# Patient Record
Sex: Female | Born: 2000 | Race: White | Hispanic: No | Marital: Single | Smoking: Never smoker
Health system: Southern US, Community
[De-identification: ages and names within clinical notes are randomized; demographics above are authoritative.]

## PROBLEM LIST (undated history)

## (undated) DIAGNOSIS — D649 Anemia, unspecified: Secondary | ICD-10-CM

## (undated) DIAGNOSIS — T7840XA Allergy, unspecified, initial encounter: Secondary | ICD-10-CM

## (undated) DIAGNOSIS — F419 Anxiety disorder, unspecified: Secondary | ICD-10-CM

## (undated) HISTORY — DX: Anemia, unspecified: D64.9

## (undated) HISTORY — DX: Anxiety disorder, unspecified: F41.9

## (undated) HISTORY — DX: Allergy, unspecified, initial encounter: T78.40XA

---
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
## 2021-02-07 DIAGNOSIS — Z20828 Contact with and (suspected) exposure to other viral communicable diseases: Secondary | ICD-10-CM | POA: Diagnosis not present

## 2021-02-07 DIAGNOSIS — J069 Acute upper respiratory infection, unspecified: Secondary | ICD-10-CM | POA: Diagnosis not present

## 2021-02-07 DIAGNOSIS — J019 Acute sinusitis, unspecified: Secondary | ICD-10-CM | POA: Diagnosis not present

## 2021-03-24 DIAGNOSIS — H5203 Hypermetropia, bilateral: Secondary | ICD-10-CM | POA: Diagnosis not present

## 2021-03-24 DIAGNOSIS — H47292 Other optic atrophy, left eye: Secondary | ICD-10-CM | POA: Diagnosis not present

## 2021-06-10 DIAGNOSIS — J0100 Acute maxillary sinusitis, unspecified: Secondary | ICD-10-CM | POA: Diagnosis not present

## 2021-07-02 DIAGNOSIS — J0100 Acute maxillary sinusitis, unspecified: Secondary | ICD-10-CM | POA: Diagnosis not present

## 2021-07-02 DIAGNOSIS — R059 Cough, unspecified: Secondary | ICD-10-CM | POA: Diagnosis not present

## 2021-07-02 DIAGNOSIS — J209 Acute bronchitis, unspecified: Secondary | ICD-10-CM | POA: Diagnosis not present

## 2021-07-02 DIAGNOSIS — J029 Acute pharyngitis, unspecified: Secondary | ICD-10-CM | POA: Diagnosis not present

## 2021-07-02 DIAGNOSIS — Z20828 Contact with and (suspected) exposure to other viral communicable diseases: Secondary | ICD-10-CM | POA: Diagnosis not present

## 2021-08-18 DIAGNOSIS — N3000 Acute cystitis without hematuria: Secondary | ICD-10-CM | POA: Diagnosis not present

## 2021-08-18 DIAGNOSIS — L739 Follicular disorder, unspecified: Secondary | ICD-10-CM | POA: Diagnosis not present

## 2021-08-18 DIAGNOSIS — Z6823 Body mass index (BMI) 23.0-23.9, adult: Secondary | ICD-10-CM | POA: Diagnosis not present

## 2021-10-05 DIAGNOSIS — L700 Acne vulgaris: Secondary | ICD-10-CM | POA: Diagnosis not present

## 2021-11-26 DIAGNOSIS — Z79899 Other long term (current) drug therapy: Secondary | ICD-10-CM | POA: Diagnosis not present

## 2021-11-26 DIAGNOSIS — L700 Acne vulgaris: Secondary | ICD-10-CM | POA: Diagnosis not present

## 2022-07-07 ENCOUNTER — Other Ambulatory Visit: Payer: Self-pay | Admitting: Family Medicine

## 2022-07-07 DIAGNOSIS — L680 Hirsutism: Secondary | ICD-10-CM

## 2022-07-09 ENCOUNTER — Other Ambulatory Visit: Payer: Self-pay

## 2022-07-12 ENCOUNTER — Ambulatory Visit
Admission: RE | Admit: 2022-07-12 | Discharge: 2022-07-12 | Disposition: A | Discharge status: Home / Self Care | Payer: No Typology Code available for payment source | Source: Ambulatory Visit | Attending: Family Medicine | Admitting: Family Medicine

## 2022-07-12 DIAGNOSIS — L680 Hirsutism: Secondary | ICD-10-CM

## 2022-12-27 DIAGNOSIS — L700 Acne vulgaris: Secondary | ICD-10-CM | POA: Diagnosis not present

## 2023-01-06 DIAGNOSIS — Z79899 Other long term (current) drug therapy: Secondary | ICD-10-CM | POA: Diagnosis not present

## 2023-01-27 DIAGNOSIS — L700 Acne vulgaris: Secondary | ICD-10-CM | POA: Diagnosis not present

## 2023-02-01 ENCOUNTER — Ambulatory Visit (INDEPENDENT_AMBULATORY_CARE_PROVIDER_SITE_OTHER): Payer: Commercial Managed Care - PPO | Admitting: Internal Medicine

## 2023-02-01 ENCOUNTER — Encounter: Payer: Self-pay | Admitting: Internal Medicine

## 2023-02-01 VITALS — BP 124/80 | HR 58 | Wt 143.0 lb

## 2023-02-01 DIAGNOSIS — L709 Acne, unspecified: Secondary | ICD-10-CM | POA: Diagnosis not present

## 2023-02-01 DIAGNOSIS — L680 Hirsutism: Secondary | ICD-10-CM | POA: Insufficient documentation

## 2023-02-01 DIAGNOSIS — N914 Secondary oligomenorrhea: Secondary | ICD-10-CM | POA: Diagnosis not present

## 2023-02-01 LAB — LIPID PANEL
Cholesterol: 149 mg/dL (ref 0–200)
HDL: 57.7 mg/dL (ref >39.00)
LDL Cholesterol: 68 mg/dL (ref 0–99)
NonHDL: 90.85
Total CHOL/HDL Ratio: 3
Triglycerides: 116 mg/dL (ref 0.0–149.0)
VLDL: 23.2 mg/dL (ref 0.0–40.0)

## 2023-02-01 LAB — COMPREHENSIVE METABOLIC PANEL
ALT: 9 U/L (ref 0–35)
AST: 14 U/L (ref 0–37)
Albumin: 4.3 g/dL (ref 3.5–5.2)
Alkaline Phosphatase: 71 U/L (ref 39–117)
BUN: 9 mg/dL (ref 6–23)
CO2: 25 mEq/L (ref 19–32)
Calcium: 9.9 mg/dL (ref 8.4–10.5)
Chloride: 104 mEq/L (ref 96–112)
Creatinine, Ser: 0.71 mg/dL (ref 0.40–1.20)
GFR: 122.33 mL/min (ref >60.00)
Glucose, Bld: 80 mg/dL (ref 70–99)
Potassium: 4 mEq/L (ref 3.5–5.1)
Sodium: 138 mEq/L (ref 135–145)
Total Bilirubin: 0.4 mg/dL (ref 0.2–1.2)
Total Protein: 7.4 g/dL (ref 6.0–8.3)

## 2023-02-01 LAB — HEMOGLOBIN A1C: Hgb A1c MFr Bld: 5.5 % (ref 4.6–6.5)

## 2023-02-01 LAB — TSH: TSH: 0.58 u[IU]/mL (ref 0.35–5.50)

## 2023-02-01 LAB — T4, FREE: Free T4: 0.65 ng/dL (ref 0.60–1.60)

## 2023-02-01 NOTE — Progress Notes (Unsigned)
Name: Bailey Owens  MRN/ DOB: [id number], 27-Jun-2001    Age/ Sex: 22 y.o., female    PCP: Leonides Sake, MD   Reason for Endocrinology Evaluation: Hirsutism     Date of Initial Endocrinology Evaluation: 02/01/2023     HPI: Ms. Bailey Owens is a 22 y.o. female with a past medical history of Acne. The patient presented for initial endocrinology clinic visit on 02/01/2023 for consultative assistance with her Hirsutism .   Pt follows with Dermatology for acne, she is on COC's and Accutane . She was noted with hirsutism over the face, chest at age 22  From there she was referred to [facility] for firther evaluation of PCOS   Prolactin, 17-OH progesterone, and testosterone were normal  DHEAS low   Pelvic ultrasound was unrevealing 07/2022  She is accompanied by her hair  Today she continues to have hirsutism ovr the face, and  chin chest and abdomen  Menarche at 12 yr  Has always been irregular  Denies dyslipidemia or diabetes   Has anxiety but no depression or eating disorder Mother states pt with thinning of hair  Weight stable  She was on Spironolactone for ~ 1 month without side effects through dermatology  Has hydradenitis suppurativa on thighs   No Fh of PCOS or infertility  Paternal  granmother and aunt and other fh of DM  Maternal grandmother with thyroid disease, siter with Hashimoto's disease       HISTORY:  Past Medical History: No past medical history on file. Past Surgical History: *** The histories are not reviewed yet. Please review them in the "History" navigator section and refresh this [place].  Social History:  reports that she has never smoked. She has never been exposed to tobacco smoke. She has never used smokeless tobacco. Family History: family history is not on file.   HOME MEDICATIONS: Allergies as of 02/01/2023       Reactions   Penicillin G Rash        Medication List        Accurate as of February 01, 2023  1:22  PM. If you have any questions, ask your nurse or doctor.          drospirenone-ethinyl estradiol 3-0.02 MG tablet Commonly known as: YAZ Take 1 tablet by mouth daily.   ISOtretinoin 30 MG capsule Commonly known as: ACCUTANE Take 30 mg by mouth daily.          REVIEW OF SYSTEMS: A comprehensive ROS was conducted with the patient and is negative except as per HPI and below:  ROS     OBJECTIVE:  VS: BP 124/80 (BP Location: Left Arm, Patient Position: Sitting, Cuff Size: Small)   Pulse (!) 58   Wt 143 lb (64.9 kg)   SpO2 97%    Wt Readings from Last 3 Encounters:  02/01/23 143 lb (64.9 kg)     EXAM: General: Pt appears well and is in NAD Pt with dark think skin over the chest and lower abdomen   Eyes: External eye exam normal without stare, lid lag or exophthalmos.  EOM intact.    Neck: General: Supple without adenopathy. Thyroid: Thyroid size is prominent .  No goiter or nodules appreciated.  Lungs: Clear with good BS bilat with no rales, rhonchi, or wheezes  Heart: Auscultation: RRR.  Abdomen: soft, nontender, without masses or organomegaly palpable  Extremities:  BL LE: No pretibial edema  Mental Status: Judgment, insight: Intact Orientation: Oriented to time,  place, and person Mood and affect: No depression, anxiety, or agitation     DATA REVIEWED: ***    ASSESSMENT/PLAN/RECOMMENDATIONS:   Hirsutism:    Medications :  2. Oligomenorrhea   Signed electronically by: Mack Guise, MD  [facility]  [facility] [address], [place], [place] [address] Phone: [phone] FAX: [phone]   CC: Leonides Sake, [place] [address] Phone: [phone] Fax: [phone]   Return to Endocrinology clinic as below: No future appointments.

## 2023-02-01 NOTE — Patient Instructions (Signed)

## 2023-02-03 LAB — DHEA-SULFATE: DHEA-SO4: 127 ug/dL (ref 44–286)

## 2023-02-03 LAB — THYROID PEROXIDASE ANTIBODY: Thyroperoxidase Ab SerPl-aCnc: 1 IU/mL (ref <9)

## 2023-02-04 DIAGNOSIS — E282 Polycystic ovarian syndrome: Secondary | ICD-10-CM | POA: Insufficient documentation

## 2023-02-04 LAB — 17-HYDROXYPROGESTERONE: 17-OH-Progesterone, LC/MS/MS: 50 ng/dL

## 2023-02-05 LAB — PROLACTIN: Prolactin: 8.9 ng/mL

## 2023-02-05 LAB — TESTOSTERONE, TOTAL, LC/MS/MS: Testosterone, Total, LC-MS-MS: 32 ng/dL (ref 2–45)

## 2023-02-10 ENCOUNTER — Other Ambulatory Visit: Payer: Commercial Managed Care - PPO

## 2023-02-10 DIAGNOSIS — L680 Hirsutism: Secondary | ICD-10-CM | POA: Diagnosis not present

## 2023-02-16 LAB — CORTISOL, URINE, 24 HOUR
24 Hour urine volume (VMAHVA): 1050 mL
CREATININE, URINE: 1.67 g/(24.h) (ref 0.50–2.15)
Cortisol (Ur), Free: 33.1 mcg/24 h (ref 4.0–50.0)

## 2023-03-03 DIAGNOSIS — L700 Acne vulgaris: Secondary | ICD-10-CM | POA: Diagnosis not present

## 2023-03-15 ENCOUNTER — Telehealth: Payer: Commercial Managed Care - PPO | Admitting: Physician Assistant

## 2023-03-15 DIAGNOSIS — R3989 Other symptoms and signs involving the genitourinary system: Secondary | ICD-10-CM

## 2023-03-15 MED ORDER — NITROFURANTOIN MONOHYD MACRO 100 MG PO CAPS: 100.0000 mg | ORAL_CAPSULE | Freq: Two times a day (BID) | ORAL | 0 refills | Status: DC

## 2023-03-15 NOTE — Progress Notes (Signed)
E-Visit for Urinary Problems  We are sorry that you are not feeling well.  Here is how we plan to help!  Based on what you shared with me it looks like you most likely have a simple urinary tract infection.  A UTI (Urinary Tract Infection) is a bacterial infection of the bladder.  Most cases of urinary tract infections are simple to treat but a key part of your care is to encourage you to drink plenty of fluids and watch your symptoms carefully.  I have prescribed MacroBid 100 mg twice a day for 5 days.  Your symptoms should gradually improve. Call us if the burning in your urine worsens, you develop worsening fever, back pain or pelvic pain or if your symptoms do not resolve after completing the antibiotic.  Urinary tract infections can be prevented by drinking plenty of water to keep your body hydrated.  Also be sure when you wipe, wipe from front to back and don't hold it in!  If possible, empty your bladder every 4 hours.  HOME CARE Drink plenty of fluids Compete the full course of the antibiotics even if the symptoms resolve Remember, when you need to go.go. Holding in your urine can increase the likelihood of getting a UTI! GET HELP RIGHT AWAY IF: You cannot urinate You get a high fever Worsening back pain occurs You see blood in your urine You feel sick to your stomach or throw up You feel like you are going to pass out  MAKE SURE YOU  Understand these instructions. Will watch your condition. Will get help right away if you are not doing well or get worse.   Thank you for choosing an e-visit.  Your e-visit answers were reviewed by a board certified advanced clinical practitioner to complete your personal care plan. Depending upon the condition, your plan could have included both over the counter or prescription medications.  Please review your pharmacy choice. Make sure the pharmacy is open so you can pick up prescription now. If there is a problem, you may contact your  provider through MyChart messaging and have the prescription routed to another pharmacy.  Your safety is important to us. If you have drug allergies check your prescription carefully.   For the next 24 hours you can use MyChart to ask questions about today's visit, request a non-urgent call back, or ask for a work or school excuse. You will get an email in the next two days asking about your experience. I hope that your e-visit has been valuable and will speed your recovery.  I have spent 5 minutes in review of e-visit questionnaire, review and updating patient chart, medical decision making and response to patient.   Adithya Difrancesco M Ardean Melroy, PA-C  

## 2023-04-05 DIAGNOSIS — L700 Acne vulgaris: Secondary | ICD-10-CM | POA: Diagnosis not present

## 2023-04-28 ENCOUNTER — Telehealth: Payer: Commercial Managed Care - PPO | Admitting: Family Medicine

## 2023-04-28 DIAGNOSIS — N907 Vulvar cyst: Secondary | ICD-10-CM

## 2023-04-28 NOTE — Progress Notes (Signed)
Because a cyst or bump on labia and pain with intercourse, you need to be seen in person to rule out the type of cyst it could be, I feel your condition warrants further evaluation and I recommend that you be seen in a face to face visit.   NOTE: There will be NO CHARGE for this eVisit

## 2023-05-10 DIAGNOSIS — L732 Hidradenitis suppurativa: Secondary | ICD-10-CM | POA: Diagnosis not present

## 2023-05-10 DIAGNOSIS — L700 Acne vulgaris: Secondary | ICD-10-CM | POA: Diagnosis not present

## 2023-05-22 ENCOUNTER — Telehealth: Payer: Commercial Managed Care - PPO | Admitting: Nurse Practitioner

## 2023-05-22 DIAGNOSIS — R3989 Other symptoms and signs involving the genitourinary system: Secondary | ICD-10-CM

## 2023-05-22 MED ORDER — NITROFURANTOIN MONOHYD MACRO 100 MG PO CAPS: 100.0000 mg | ORAL_CAPSULE | Freq: Two times a day (BID) | ORAL | 0 refills | Status: DC

## 2023-05-22 NOTE — Progress Notes (Signed)
I have spent 5 minutes in review of e-visit questionnaire, review and updating patient chart, medical decision making and response to patient.  ° °Damione Robideau W Jodee Wagenaar, NP ° °  °

## 2023-05-22 NOTE — Progress Notes (Signed)

## 2023-06-03 DIAGNOSIS — L732 Hidradenitis suppurativa: Secondary | ICD-10-CM | POA: Diagnosis not present

## 2023-06-03 DIAGNOSIS — L700 Acne vulgaris: Secondary | ICD-10-CM | POA: Diagnosis not present

## 2023-07-08 DIAGNOSIS — L732 Hidradenitis suppurativa: Secondary | ICD-10-CM | POA: Diagnosis not present

## 2023-07-08 DIAGNOSIS — L700 Acne vulgaris: Secondary | ICD-10-CM | POA: Diagnosis not present

## 2023-08-09 DIAGNOSIS — L700 Acne vulgaris: Secondary | ICD-10-CM | POA: Diagnosis not present

## 2023-08-09 DIAGNOSIS — L738 Other specified follicular disorders: Secondary | ICD-10-CM | POA: Diagnosis not present

## 2023-08-12 ENCOUNTER — Ambulatory Visit: Payer: 59 | Admitting: Internal Medicine

## 2023-08-19 DIAGNOSIS — E282 Polycystic ovarian syndrome: Secondary | ICD-10-CM | POA: Diagnosis not present

## 2023-08-19 DIAGNOSIS — E039 Hypothyroidism, unspecified: Secondary | ICD-10-CM | POA: Diagnosis not present

## 2023-08-19 DIAGNOSIS — E538 Deficiency of other specified B group vitamins: Secondary | ICD-10-CM | POA: Diagnosis not present

## 2023-08-19 DIAGNOSIS — E611 Iron deficiency: Secondary | ICD-10-CM | POA: Diagnosis not present

## 2023-08-19 DIAGNOSIS — E612 Magnesium deficiency: Secondary | ICD-10-CM | POA: Diagnosis not present

## 2023-08-19 DIAGNOSIS — Z131 Encounter for screening for diabetes mellitus: Secondary | ICD-10-CM | POA: Diagnosis not present

## 2023-08-19 DIAGNOSIS — R4189 Other symptoms and signs involving cognitive functions and awareness: Secondary | ICD-10-CM | POA: Diagnosis not present

## 2023-08-19 DIAGNOSIS — L659 Nonscarring hair loss, unspecified: Secondary | ICD-10-CM | POA: Diagnosis not present

## 2023-08-19 DIAGNOSIS — E559 Vitamin D deficiency, unspecified: Secondary | ICD-10-CM | POA: Diagnosis not present

## 2023-08-19 DIAGNOSIS — D513 Other dietary vitamin B12 deficiency anemia: Secondary | ICD-10-CM | POA: Diagnosis not present

## 2023-08-19 DIAGNOSIS — F419 Anxiety disorder, unspecified: Secondary | ICD-10-CM | POA: Diagnosis not present

## 2023-08-19 DIAGNOSIS — R42 Dizziness and giddiness: Secondary | ICD-10-CM | POA: Diagnosis not present

## 2023-09-08 DIAGNOSIS — L700 Acne vulgaris: Secondary | ICD-10-CM | POA: Diagnosis not present

## 2023-09-08 DIAGNOSIS — L732 Hidradenitis suppurativa: Secondary | ICD-10-CM | POA: Diagnosis not present

## 2023-09-22 DIAGNOSIS — E611 Iron deficiency: Secondary | ICD-10-CM | POA: Diagnosis not present

## 2023-09-22 DIAGNOSIS — E282 Polycystic ovarian syndrome: Secondary | ICD-10-CM | POA: Diagnosis not present

## 2023-09-22 DIAGNOSIS — E559 Vitamin D deficiency, unspecified: Secondary | ICD-10-CM | POA: Diagnosis not present

## 2023-09-22 DIAGNOSIS — D513 Other dietary vitamin B12 deficiency anemia: Secondary | ICD-10-CM | POA: Diagnosis not present

## 2023-09-22 DIAGNOSIS — E538 Deficiency of other specified B group vitamins: Secondary | ICD-10-CM | POA: Diagnosis not present

## 2023-09-22 DIAGNOSIS — L659 Nonscarring hair loss, unspecified: Secondary | ICD-10-CM | POA: Diagnosis not present

## 2023-09-22 DIAGNOSIS — F419 Anxiety disorder, unspecified: Secondary | ICD-10-CM | POA: Diagnosis not present

## 2023-09-22 DIAGNOSIS — E612 Magnesium deficiency: Secondary | ICD-10-CM | POA: Diagnosis not present

## 2023-09-22 DIAGNOSIS — R4189 Other symptoms and signs involving cognitive functions and awareness: Secondary | ICD-10-CM | POA: Diagnosis not present

## 2023-09-22 DIAGNOSIS — E039 Hypothyroidism, unspecified: Secondary | ICD-10-CM | POA: Diagnosis not present

## 2023-10-13 DIAGNOSIS — L700 Acne vulgaris: Secondary | ICD-10-CM | POA: Diagnosis not present

## 2023-10-13 DIAGNOSIS — L732 Hidradenitis suppurativa: Secondary | ICD-10-CM | POA: Diagnosis not present

## 2024-01-16 DIAGNOSIS — L700 Acne vulgaris: Secondary | ICD-10-CM | POA: Diagnosis not present

## 2024-01-16 DIAGNOSIS — L732 Hidradenitis suppurativa: Secondary | ICD-10-CM | POA: Diagnosis not present

## 2024-01-22 ENCOUNTER — Telehealth: Payer: Commercial Managed Care - PPO | Admitting: Nurse Practitioner

## 2024-01-22 DIAGNOSIS — N760 Acute vaginitis: Secondary | ICD-10-CM | POA: Diagnosis not present

## 2024-01-22 DIAGNOSIS — R3989 Other symptoms and signs involving the genitourinary system: Secondary | ICD-10-CM

## 2024-01-22 MED ORDER — FLUCONAZOLE 150 MG PO TABS: 150.0000 mg | ORAL_TABLET | Freq: Once | ORAL | 0 refills | Status: AC

## 2024-01-22 MED ORDER — NITROFURANTOIN MONOHYD MACRO 100 MG PO CAPS: 100.0000 mg | ORAL_CAPSULE | Freq: Two times a day (BID) | ORAL | 0 refills | Status: DC

## 2024-01-22 NOTE — Progress Notes (Signed)
 I have spent 5 minutes in review of e-visit questionnaire, review and updating patient chart, medical decision making and response to patient.   Claiborne Rigg, NP

## 2024-01-22 NOTE — Progress Notes (Signed)

## 2024-07-17 ENCOUNTER — Ambulatory Visit (HOSPITAL_BASED_OUTPATIENT_CLINIC_OR_DEPARTMENT_OTHER): Admitting: Student

## 2024-07-18 ENCOUNTER — Ambulatory Visit (HOSPITAL_BASED_OUTPATIENT_CLINIC_OR_DEPARTMENT_OTHER): Admitting: Student

## 2024-07-18 ENCOUNTER — Encounter (HOSPITAL_BASED_OUTPATIENT_CLINIC_OR_DEPARTMENT_OTHER): Payer: Self-pay | Admitting: Student

## 2024-07-18 ENCOUNTER — Other Ambulatory Visit (HOSPITAL_BASED_OUTPATIENT_CLINIC_OR_DEPARTMENT_OTHER): Payer: Self-pay

## 2024-07-18 VITALS — BP 109/71 | HR 52 | Ht 66.0 in | Wt 140.4 lb

## 2024-07-18 DIAGNOSIS — Z7689 Persons encountering health services in other specified circumstances: Secondary | ICD-10-CM

## 2024-07-18 DIAGNOSIS — G479 Sleep disorder, unspecified: Secondary | ICD-10-CM | POA: Diagnosis not present

## 2024-07-18 DIAGNOSIS — F322 Major depressive disorder, single episode, severe without psychotic features: Secondary | ICD-10-CM | POA: Diagnosis not present

## 2024-07-18 DIAGNOSIS — E282 Polycystic ovarian syndrome: Secondary | ICD-10-CM | POA: Diagnosis not present

## 2024-07-18 MED ORDER — ESCITALOPRAM OXALATE 10 MG PO TABS
ORAL_TABLET | ORAL | 5 refills | Status: DC
  Filled 2024-07-18: qty 30, 37d supply, fill #0

## 2024-07-18 NOTE — Progress Notes (Signed)
 New Patient Office Visit  Subjective    Patient ID: Bailey Owens, female    DOB: 2000/12/28  Age: 23 y.o. MRN: [id number]  CC:  Chief Complaint  Patient presents with   Establish Care    Discussed the use of AI scribe software for clinical note transcription with the patient, who gave verbal consent to proceed.  History of Present Illness   Bailey Owens is a 23 year old female who presents for establishing care and an annual physical exam.  She has a history of anxiety and previously took Lexapro , which she discontinued two to three years ago. Since stopping the medication, her symptoms have worsened, which she attributes to environmental factors. No thoughts of self-harm or harm to others. Her PHQ-9 score is 17.  She follows a vegetarian diet but describes it as poor, consisting mainly of fast food and carbohydrates, with low protein intake. She is attempting to incorporate more chicken into her diet but avoids red meat. She experiences low energy levels throughout the day.  She engages in physical activity by walking once a week and playing pickleball. She is a Holiday representative at [employer], Careers adviser, and works part-time at [employer].  She reports difficulty with sleep, including trouble falling asleep and staying asleep, often waking up at night. She experiences low energy levels during the day and sometimes has headaches.  She is currently on Yaz for PCOS and denies any chance of pregnancy. No smoking or vaping.  Family history includes Alzheimer's on her paternal grandfather's side and breast cancer in her maternal grandmother, who was diagnosed in her early 22s. There is also a history of IBD, hypertension, arthritis, and ADD on her mother's side.      Screenings:  Colon Cancer: not indicated Lung Cancer: no smoking or vaping Breast Cancer: mgm- breast cancer. Dx early 78s.  Diabetes: indicated HLD: indicated   Outpatient Encounter Medications as of  07/18/2024  Medication Sig   drospirenone-ethinyl estradiol (YAZ) 3-0.02 MG tablet Take 1 tablet by mouth daily.   escitalopram  (LEXAPRO ) 10 MG tablet Take 0.5 tablets (5 mg total) by mouth daily for 14 days, THEN 1 tablet (10 mg total) daily thereafter.   nitrofurantoin , macrocrystal-monohydrate, (MACROBID ) 100 MG capsule Take 1 capsule (100 mg total) by mouth 2 (two) times daily. (Patient not taking: Reported on 07/18/2024)   No facility-administered encounter medications on file as of 07/18/2024.    Past Medical History:  Diagnosis Date   Allergy    Anemia    Anxiety     History reviewed. No pertinent surgical history.  Family History  Problem Relation Age of Onset   Hypertension Mother    Arthritis Mother    Anxiety disorder Mother    ADD / ADHD Father    Asthma Sister    Varicose Veins Maternal Grandmother    Cancer Maternal Grandmother    Vision loss Maternal Grandfather    Diabetes Maternal Grandfather     Social History   Socioeconomic History   Marital status: Single    Spouse name: Not on file   Number of children: Not on file   Years of education: Not on file   Highest education level: Not on file  Occupational History   Not on file  Tobacco Use   Smoking status: Never    Passive exposure: Never   Smokeless tobacco: Never  Substance and Sexual Activity   Alcohol use: Not on file   Drug use: Not on file  Sexual activity: Not on file  Other Topics Concern   Not on file  Social History Narrative   Not on file   Social Drivers of Health   Financial Resource Strain: Low Risk  (07/18/2024)   Overall Financial Resource Strain (CARDIA)    Difficulty of Paying Living Expenses: Not very hard  Food Insecurity: No Food Insecurity (07/18/2024)   Hunger Vital Sign    Worried About Running Out of Food in the Last Year: Never true    Ran Out of Food in the Last Year: Never true  Transportation Needs: No Transportation Needs (07/18/2024)   PRAPARE - Therapist, art (Medical): No    Lack of Transportation (Non-Medical): No  Physical Activity: Insufficiently Active (07/18/2024)   Exercise Vital Sign    Days of Exercise per Week: 1 day    Minutes of Exercise per Session: 30 min  Stress: Stress Concern Present (07/18/2024)   [employer] of Occupational Health - Occupational Stress Questionnaire    Feeling of Stress: Very much  Social Connections: Moderately Integrated (07/18/2024)   Social Connection and Isolation Panel    Frequency of Communication with Friends and Family: More than three times a week    Frequency of Social Gatherings with Friends and Family: Twice a week    Attends Religious Services: 1 to 4 times per year    Active Member of [employer] or Organizations: Yes    Attends Banker Meetings: 1 to 4 times per year    Marital Status: Never married  Intimate Partner Violence: Not At Risk (07/18/2024)   Humiliation, Afraid, Rape, and Kick questionnaire    Fear of Current or Ex-Partner: No    Emotionally Abused: No    Physically Abused: No    Sexually Abused: No    ROS  Per HPI      Objective    BP 109/71   Pulse (!) 52   Ht 5' 6 (1.676 m)   Wt 140 lb 6.4 oz (63.7 kg)   LMP 06/12/2024 (Approximate)   SpO2 98%   BMI 22.66 kg/m   Physical Exam Constitutional:      General: She is not in acute distress.    Appearance: Normal appearance. She is not ill-appearing or diaphoretic.  HENT:     Head: Normocephalic and atraumatic.     Right Ear: Tympanic membrane, ear canal and external ear normal.     Left Ear: Tympanic membrane, ear canal and external ear normal.     Nose: Nose normal.     Mouth/Throat:     Mouth: Mucous membranes are moist.     Pharynx: Oropharynx is clear.  Eyes:     General: No scleral icterus.       Right eye: No discharge.        Left eye: No discharge.     Extraocular Movements: Extraocular movements intact.     Conjunctiva/sclera: Conjunctivae normal.      Pupils: Pupils are equal, round, and reactive to light.  Neck:     Thyroid : No thyroid  mass, thyromegaly or thyroid  tenderness.     Vascular: No carotid bruit.  Cardiovascular:     Rate and Rhythm: Normal rate and regular rhythm.     Pulses: Normal pulses.     Heart sounds: Normal heart sounds. No murmur heard.    No friction rub. No gallop.  Pulmonary:     Effort: Pulmonary effort is normal.     Breath sounds:  Normal breath sounds. No wheezing, rhonchi or rales.  Chest:     Chest wall: No tenderness.  Abdominal:     General: Bowel sounds are normal. There is no distension.     Palpations: Abdomen is soft.     Tenderness: There is no abdominal tenderness. There is no guarding.  Musculoskeletal:        General: No swelling, deformity or signs of injury.     Cervical back: Neck supple.     Right lower leg: No edema.     Left lower leg: No edema.  Lymphadenopathy:     Cervical: No cervical adenopathy.     Right cervical: No superficial or posterior cervical adenopathy.    Left cervical: No superficial cervical adenopathy.  Skin:    Coloration: Skin is not jaundiced.     Findings: No rash.  Neurological:     General: No focal deficit present.     Mental Status: She is alert and oriented to person, place, and time.     Motor: No weakness.     Deep Tendon Reflexes: Reflexes normal.  Psychiatric:        Behavior: Behavior normal.         Assessment & Plan:   Assessment and Plan    Establishment of Care  Annual Physical Rhea is a vegetarian with low protein intake, primarily consuming fast food and carbohydrates, leading to low energy levels likely due to insufficient protein and iron intake. - Encourage incorporation of protein sources into diet to improve energy levels. - Advise on consuming whole grains instead of refined carbohydrates to stabilize energy levels.   Depression and Insomnia Chronic, not at goal. Verity scored 17 on the PHQ-9, indicating significant  depression, likely exacerbated by environmental factors. She reports difficulty sleeping, with awakenings at 2 AM and 4:30 AM. No suicidal ideation reported. - Refer to [facility] for counseling services. - Initiate Lexapro  at 10 mg, starting with half a tablet for one week, then increase to a full tablet. Monitor for nausea and increased sweating, especially in heat. - Schedule follow-up in six weeks to assess response to Lexapro  and overall mental health. - Provide resources for cognitive behavioral therapy for insomnia (CBT-I). - Recommend magnesium glycinate 400 mg nightly for sleep and headache relief.  Headaches Experiences occasional headaches. - Recommend magnesium glycinate 400 mg nightly.  Polycystic Ovary Syndrome (PCOS) Chronic, stable on YAZ- rx through derm. Alyiah is currently on Yaz, which is commonly used for managing PCOS symptoms.  25 modifier added due to depression workup and management in addition to new annual physical.   Return in about 6 weeks (around 08/29/2024) for depression/difficulty sleeping.   Cylinda Santoli T Rameen Gohlke, PA-C

## 2024-07-18 NOTE — Patient Instructions (Addendum)
 It was nice to see you today!  As we discussed in clinic:  Counseling Resource: [facility] [address]  [place], [place] [address]  Phone: [phone]  Fax: [phone]   Magnesium glycinate 400mg  nightly- 30 minutes before bed.  A type of therapy known as CBT-I is considered the first-line treatment for insomnia. It may even be able to help 4/5 individuals get better sleep. At this time I am unaware of any institutions offer CBT-I within our area.  There are some online therapy use which can be both effective for sleep and cost effective as well.  Go! To Sleep- Web-based App-this is a Energy manager therapy platform created by [facility] clinic for patients with insomnia.  Last time checked, the cost was around $40 (pretty cheap for better sleep!).  CBT-I Coach- iOS/Android App- This app provides education about sleep and CBT-I, personalized feedback about your sleep, and an option for sleep diary to track wake and sleep times.  Sleepio-  iOS/Android App and Web-based App- A fairly popular option, may be worth a try!  [facility] Insomnia- (text based)- this option doesn't provide with audio or with an app but cory has some helpful information for you!  If you have any problems before your next visit feel free to message me via MyChart (minor issues or questions) or call the office, otherwise you may reach out to schedule an office visit.  Thank you! Nicolina Hirt, PA-C

## 2024-07-23 ENCOUNTER — Other Ambulatory Visit (HOSPITAL_BASED_OUTPATIENT_CLINIC_OR_DEPARTMENT_OTHER)

## 2024-07-23 DIAGNOSIS — Z131 Encounter for screening for diabetes mellitus: Secondary | ICD-10-CM

## 2024-07-23 DIAGNOSIS — Z136 Encounter for screening for cardiovascular disorders: Secondary | ICD-10-CM | POA: Diagnosis not present

## 2024-07-23 DIAGNOSIS — E282 Polycystic ovarian syndrome: Secondary | ICD-10-CM

## 2024-07-23 DIAGNOSIS — Z13220 Encounter for screening for lipoid disorders: Secondary | ICD-10-CM

## 2024-07-24 ENCOUNTER — Ambulatory Visit (HOSPITAL_BASED_OUTPATIENT_CLINIC_OR_DEPARTMENT_OTHER): Payer: Self-pay | Admitting: Student

## 2024-07-24 DIAGNOSIS — E875 Hyperkalemia: Secondary | ICD-10-CM

## 2024-07-24 LAB — LIPID PANEL
Chol/HDL Ratio: 2.3 ratio (ref 0.0–4.4)
Cholesterol, Total: 164 mg/dL (ref 100–199)
HDL: 71 mg/dL (ref >39)
LDL Chol Calc (NIH): 77 mg/dL (ref 0–99)
Triglycerides: 87 mg/dL (ref 0–149)
VLDL Cholesterol Cal: 16 mg/dL (ref 5–40)

## 2024-07-24 LAB — COMPREHENSIVE METABOLIC PANEL WITH GFR
ALT: 9 IU/L (ref 0–32)
AST: 15 IU/L (ref 0–40)
Albumin: 4.3 g/dL (ref 4.0–5.0)
Alkaline Phosphatase: 70 IU/L (ref 44–121)
BUN/Creatinine Ratio: 7 — ABNORMAL LOW (ref 9–23)
BUN: 6 mg/dL (ref 6–20)
Bilirubin Total: 0.2 mg/dL (ref 0.0–1.2)
CO2: 20 mmol/L (ref 20–29)
Calcium: 9.5 mg/dL (ref 8.7–10.2)
Chloride: 108 mmol/L — ABNORMAL HIGH (ref 96–106)
Creatinine, Ser: 0.81 mg/dL (ref 0.57–1.00)
Globulin, Total: 2.2 g/dL (ref 1.5–4.5)
Glucose: 97 mg/dL (ref 70–99)
Potassium: 5.4 mmol/L — ABNORMAL HIGH (ref 3.5–5.2)
Sodium: 143 mmol/L (ref 134–144)
Total Protein: 6.5 g/dL (ref 6.0–8.5)
eGFR: 106 mL/min/1.73 (ref >59)

## 2024-07-24 LAB — CBC WITH DIFFERENTIAL/PLATELET
Basophils Absolute: 0 x10E3/uL (ref 0.0–0.2)
Basos: 1 %
EOS (ABSOLUTE): 0.1 x10E3/uL (ref 0.0–0.4)
Eos: 2 %
Hematocrit: 42.6 % (ref 34.0–46.6)
Hemoglobin: 14.3 g/dL (ref 11.1–15.9)
Immature Grans (Abs): 0 x10E3/uL (ref 0.0–0.1)
Immature Granulocytes: 0 %
Lymphocytes Absolute: 1.7 x10E3/uL (ref 0.7–3.1)
Lymphs: 35 %
MCH: 32.1 pg (ref 26.6–33.0)
MCHC: 33.6 g/dL (ref 31.5–35.7)
MCV: 96 fL (ref 79–97)
Monocytes Absolute: 0.3 x10E3/uL (ref 0.1–0.9)
Monocytes: 7 %
Neutrophils Absolute: 2.7 x10E3/uL (ref 1.4–7.0)
Neutrophils: 55 %
Platelets: 251 x10E3/uL (ref 150–450)
RBC: 4.46 x10E6/uL (ref 3.77–5.28)
RDW: 12 % (ref 11.7–15.4)
WBC: 4.7 x10E3/uL (ref 3.4–10.8)

## 2024-07-24 LAB — HEMOGLOBIN A1C
Est. average glucose Bld gHb Est-mCnc: 105 mg/dL
Hgb A1c MFr Bld: 5.3 % (ref 4.8–5.6)

## 2024-07-26 ENCOUNTER — Encounter (HOSPITAL_BASED_OUTPATIENT_CLINIC_OR_DEPARTMENT_OTHER): Payer: Self-pay

## 2024-08-01 ENCOUNTER — Other Ambulatory Visit (INDEPENDENT_AMBULATORY_CARE_PROVIDER_SITE_OTHER)

## 2024-08-01 DIAGNOSIS — E875 Hyperkalemia: Secondary | ICD-10-CM

## 2024-08-02 ENCOUNTER — Ambulatory Visit (HOSPITAL_BASED_OUTPATIENT_CLINIC_OR_DEPARTMENT_OTHER): Payer: Self-pay | Admitting: Student

## 2024-08-02 LAB — BASIC METABOLIC PANEL WITH GFR
BUN/Creatinine Ratio: 11 (ref 9–23)
BUN: 8 mg/dL (ref 6–20)
CO2: 21 mmol/L (ref 20–29)
Calcium: 8.7 mg/dL (ref 8.7–10.2)
Chloride: 107 mmol/L — ABNORMAL HIGH (ref 96–106)
Creatinine, Ser: 0.76 mg/dL (ref 0.57–1.00)
Glucose: 93 mg/dL (ref 70–99)
Potassium: 4.4 mmol/L (ref 3.5–5.2)
Sodium: 141 mmol/L (ref 134–144)
eGFR: 114 mL/min/1.73 (ref >59)

## 2024-08-29 ENCOUNTER — Ambulatory Visit (HOSPITAL_BASED_OUTPATIENT_CLINIC_OR_DEPARTMENT_OTHER): Admitting: Student

## 2024-08-31 ENCOUNTER — Ambulatory Visit (HOSPITAL_BASED_OUTPATIENT_CLINIC_OR_DEPARTMENT_OTHER): Admitting: Student

## 2024-08-31 ENCOUNTER — Encounter (HOSPITAL_BASED_OUTPATIENT_CLINIC_OR_DEPARTMENT_OTHER): Payer: Self-pay | Admitting: Student

## 2024-08-31 VITALS — BP 115/72 | HR 79 | Temp 98.5°F | Resp 16 | Ht 66.0 in | Wt 144.8 lb

## 2024-08-31 DIAGNOSIS — Z118 Encounter for screening for other infectious and parasitic diseases: Secondary | ICD-10-CM

## 2024-08-31 DIAGNOSIS — F322 Major depressive disorder, single episode, severe without psychotic features: Secondary | ICD-10-CM | POA: Insufficient documentation

## 2024-08-31 DIAGNOSIS — Z23 Encounter for immunization: Secondary | ICD-10-CM

## 2024-08-31 MED ORDER — ESCITALOPRAM OXALATE 20 MG PO TABS: 20.0000 mg | ORAL_TABLET | Freq: Every day | ORAL | 3 refills | Status: DC

## 2024-08-31 NOTE — Progress Notes (Signed)
 Established Patient Office Visit  Subjective   Patient ID: Bailey Owens, female    DOB: Jun 01, 2001  Age: 23 y.o. MRN: [id number]  Chief Complaint  Patient presents with   Medical Management of Chronic Issues    follow up     HPI  Discussed the use of AI scribe software for clinical note transcription with the patient, who gave verbal consent to proceed.  History of Present Illness   Bailey Owens is a 23 year old female who presents for follow-up of her psychotropic medication management.  She is currently taking Lexapro  10 mg and describes her improvement as 'a little bit better' since increasing from a lower dose. She describes the improvement as 'a little bit better' and has not experienced any side effects from the medication.  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 She anticipates upcoming breaks, including fall, Thanksgiving, and winter breaks.  No symptoms such as burning urination or discharge were noted, which were reviewed as part of a chlamydia screening discussion.         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    Objective:     BP 115/72   Pulse 79   Temp 98.5  F (36.9 C) (Oral)   Resp 16   Ht 5' 6 (1.676 m)   Wt 144 lb 12.8 oz (65.7 kg)   LMP 08/26/2024 (Approximate)   SpO2 98%   BMI 23.37 kg/m  BP Readings from Last 3 Encounters:  08/31/24 115/72  07/18/24 109/71  02/01/23 124/80   Wt Readings from Last 3 Encounters:  08/31/24 144 lb 12.8 oz (65.7 kg)  07/18/24 140 lb 6.4 oz (63.7 kg)  02/01/23 143 lb (64.9 kg)      Physical Exam Constitutional:      General: She is not in acute distress.    Appearance: Normal appearance. She is not ill-appearing.  HENT:     Head: Normocephalic and atraumatic.     Nose: Nose normal.  Eyes:     General: No scleral icterus.    Conjunctiva/sclera: Conjunctivae normal.  Cardiovascular:     Rate and Rhythm: Normal rate and regular rhythm.     Heart sounds: Normal heart sounds. No murmur heard.    No friction rub.  Pulmonary:     Effort: Pulmonary effort is normal. No respiratory distress.     Breath sounds: Normal breath sounds. No wheezing, rhonchi or rales.  Musculoskeletal:        General: Normal range of motion.  Skin:  General: Skin is warm and dry.     Coloration: Skin is not jaundiced or pale.  Neurological:     General: No focal deficit present.     Mental Status: She is alert.  Psychiatric:        Mood and Affect: Mood normal.        Behavior: Behavior normal.     Comments: Mood improving.      No results found for any visits on 08/31/24.  Last CBC Lab Results  Component Value Date   WBC 4.7 07/23/2024   HGB 14.3 07/23/2024   HCT 42.6 07/23/2024   MCV 96 07/23/2024   MCH 32.1 07/23/2024   RDW 12.0 07/23/2024   PLT 251 07/23/2024   Last metabolic panel Lab Results  Component Value Date   GLUCOSE 93 08/01/2024   NA 141 08/01/2024   K 4.4 08/01/2024   CL 107 (H) 08/01/2024   CO2 21 08/01/2024   BUN 8 08/01/2024   CREATININE 0.76 08/01/2024   EGFR 114 08/01/2024   CALCIUM 8.7 08/01/2024   PROT 6.5 07/23/2024   ALBUMIN 4.3 07/23/2024   LABGLOB 2.2  07/23/2024   BILITOT 0.2 07/23/2024   ALKPHOS 70 07/23/2024   AST 15 07/23/2024   ALT 9 07/23/2024   Last lipids Lab Results  Component Value Date   CHOL 164 07/23/2024   HDL 71 07/23/2024   LDLCALC 77 07/23/2024   TRIG 87 07/23/2024   CHOLHDL 2.3 07/23/2024   Last hemoglobin A1c Lab Results  Component Value Date   HGBA1C 5.3 07/23/2024      The ASCVD Risk score (Arnett DK, et al., 2019) failed to calculate for the following reasons:   The 2019 ASCVD risk score is only valid for ages 63 to 75    Assessment & Plan:   Assessment and Plan    Depression Chronic, improving but not at goal. Depression shows slight improvement with current Lexapro  10 mg regimen. No side effects reported. Consideration for adding Wellbutrin if further improvement is not achieved. - Increase Lexapro  to 20 mg by taking two 10 mg pills until the new prescription is filled. - Prescribe 30 tablets of Lexapro  20 mg. - Consider adding Wellbutrin if Lexapro  does not provide sufficient improvement. Discussed Wellbutrin's tolerability and potential side effects, including bad dreams and headaches. - Schedule follow-up in three months, with earlier contact if symptoms worsen.  General Health Maintenance TDAP vaccination is due. Chlamydia screening is indicated annually and has not been done recently. - Administer TDAP vaccine. - Order chlamydia screening via urine test.      Return in about 3 months (around 11/30/2024) for Depression follow up.    Lora Chavers T Elsworth Ledin, PA-C

## 2024-08-31 NOTE — Patient Instructions (Addendum)
 It was nice to see you today!  If you feel that the new dose of the lexapro  is adequate and you do not need the 3 month appointment, you may bump the appointment out to the time of your next physical.   If you have any problems before your next visit feel free to message me via MyChart (minor issues or questions) or call the office, otherwise you may reach out to schedule an office visit.  Thank you! Drey Shaff, PA-C

## 2024-09-06 ENCOUNTER — Other Ambulatory Visit (HOSPITAL_BASED_OUTPATIENT_CLINIC_OR_DEPARTMENT_OTHER)

## 2024-09-06 DIAGNOSIS — Z118 Encounter for screening for other infectious and parasitic diseases: Secondary | ICD-10-CM | POA: Diagnosis not present

## 2024-09-10 LAB — CHLAMYDIA TRACHOMATIS, DNA, AMP PROBE: Chlamydia trachomatis, NAA: NEGATIVE

## 2024-09-11 ENCOUNTER — Ambulatory Visit (HOSPITAL_BASED_OUTPATIENT_CLINIC_OR_DEPARTMENT_OTHER): Payer: Self-pay | Admitting: Student

## 2024-09-27 ENCOUNTER — Other Ambulatory Visit (HOSPITAL_BASED_OUTPATIENT_CLINIC_OR_DEPARTMENT_OTHER): Payer: Self-pay | Admitting: Student

## 2024-09-27 DIAGNOSIS — F322 Major depressive disorder, single episode, severe without psychotic features: Secondary | ICD-10-CM

## 2024-10-24 ENCOUNTER — Encounter (HOSPITAL_BASED_OUTPATIENT_CLINIC_OR_DEPARTMENT_OTHER): Payer: Self-pay | Admitting: Student

## 2024-10-24 ENCOUNTER — Other Ambulatory Visit (HOSPITAL_BASED_OUTPATIENT_CLINIC_OR_DEPARTMENT_OTHER): Payer: Self-pay

## 2024-10-24 ENCOUNTER — Ambulatory Visit (HOSPITAL_BASED_OUTPATIENT_CLINIC_OR_DEPARTMENT_OTHER): Admitting: Student

## 2024-10-24 VITALS — BP 117/73 | HR 62 | Temp 98.6°F | Resp 16 | Ht 66.0 in | Wt 150.9 lb

## 2024-10-24 DIAGNOSIS — R42 Dizziness and giddiness: Secondary | ICD-10-CM | POA: Diagnosis not present

## 2024-10-24 DIAGNOSIS — F322 Major depressive disorder, single episode, severe without psychotic features: Secondary | ICD-10-CM | POA: Diagnosis not present

## 2024-10-24 MED ORDER — BUPROPION HCL ER (XL) 150 MG PO TB24
150.0000 mg | ORAL_TABLET | Freq: Every day | ORAL | 2 refills | Status: AC
  Filled 2024-10-24: qty 30, 30d supply, fill #0

## 2024-10-24 MED ORDER — BUPROPION HCL ER (XL) 150 MG PO TB24: 150.0000 mg | ORAL_TABLET | Freq: Every day | ORAL | 2 refills | Status: DC

## 2024-10-24 MED ORDER — MECLIZINE HCL 25 MG PO TABS: 25.0000 mg | ORAL_TABLET | Freq: Three times a day (TID) | ORAL | 0 refills | Status: DC | PRN

## 2024-10-24 MED ORDER — MECLIZINE HCL 25 MG PO TABS
25.0000 mg | ORAL_TABLET | Freq: Three times a day (TID) | ORAL | 0 refills | Status: AC | PRN
  Filled 2024-10-24: qty 30, 10d supply, fill #0

## 2024-10-24 NOTE — Patient Instructions (Addendum)
 It was nice to see you today!  - Please take ibuprofen as needed for your headaches - it sounds like you may be getting sick, if you develop any facial pain, pressure, elevated temperature, chills, or sweats please let me know. - This may be a mixture of dehydration, sickness, and traditional vertigo. - If the dizziness lasts for the next week, please make sure to follow back up with me so that we can get imaging.  If you have any problems before your next visit feel free to message me via MyChart (minor issues or questions) or call the office, otherwise you may reach out to schedule an office visit.  Thank you! Solstice Lastinger, PA-C

## 2024-10-24 NOTE — Progress Notes (Signed)
 Established Patient Office Visit  Subjective   Patient ID: Bailey Owens, female    DOB: 2001/06/24  Age: 23 y.o. MRN: [id number]  Chief Complaint  Patient presents with   Dizziness    Dizziness and headache since Sunday afternoon. Had some diarrhea Monday.    HPI  Patient is a 23 year old female presenting with CC of Dizziness.  Dizziness has been noted since Sunday, she states that it does come and go.  Patient notes that the dizziness is worse in the morning and is accompanied by the headache.  Dizziness does not come on before the headache, only once a day for started.  Ibuprofen did help with the headache but she has only taken ibuprofen once.  Patient also notes that on Monday and Tuesday she did have diarrhea as well as fever for a couple of days.  She states that she mostly tries to stay hydrated with a 40 ounce bottle of water, but she was not able to drink this much as of yesterday.  Patient notes no other symptoms including chest pain, shortness of breath, palpitations, numbness, or tingling.   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    Objective:     BP 117/73   Pulse 62   Temp 98.6 F (37 C) (Oral)   Resp 16   Ht 5' 6 (1.676 m)   Wt 150 lb 14.4 oz (68.4 kg)   LMP 10/19/2024 (Exact Date)   SpO2 97%   BMI 24.36 kg/m  BP  Readings from Last 3 Encounters:  10/24/24 117/73  08/31/24 115/72  07/18/24 109/71   Wt Readings from Last 3 Encounters:  10/24/24 150 lb 14.4 oz (68.4 kg)  08/31/24 144 lb 12.8 oz (65.7 kg)  07/18/24 140 lb 6.4 oz (63.7 kg)   SpO2 Readings from Last 3 Encounters:  10/24/24 97%  08/31/24 98%  07/18/24 98%      Physical Exam Constitutional:      General: She is not in acute distress.    Appearance: Normal appearance. She is not ill-appearing or toxic-appearing.  HENT:     Head: Normocephalic and atraumatic.     Right Ear: Tympanic membrane, ear canal and external ear normal.     Left Ear: Tympanic membrane, ear canal and external ear normal.     Nose: Nose normal.  Eyes:     General: No scleral icterus.    Conjunctiva/sclera: Conjunctivae normal.  Cardiovascular:     Rate and Rhythm: Normal rate and regular rhythm.     Heart sounds: Normal heart sounds. No murmur heard.    No friction rub.  Pulmonary:     Effort: Pulmonary effort is normal. No respiratory distress.     Breath sounds: Normal breath sounds. No  wheezing, rhonchi or rales.  Musculoskeletal:        General: Normal range of motion.     Cervical back: No rigidity or tenderness.  Skin:    General: Skin is warm and dry.     Coloration: Skin is not jaundiced or pale.  Neurological:     General: No focal deficit present.     Mental Status: She is alert.     Comments: Mental Status:  Alert, oriented, thought content appropriate. Speech fluent without evidence of aphasia. Able to follow 2 step commands without difficulty.   Cranial Nerves:  II:  Peripheral visual fields grossly normal, pupils equal, round, reactive to light III,IV, VI: ptosis not present, extra-ocular motions intact bilaterally  V,VII: smile symmetric, facial light touch sensation equal VIII: hearing grossly normal bilaterally  IX,X: midline uvula rise  XI: bilateral shoulder shrug equal and strong XII: midline tongue extension   Motor:   5/5 in upper and lower extremities bilaterally including strong and equal grip strength and dorsiflexion/plantar flexion Sensory: grossly normal Deep Tendon Reflexes: 2+ and symmetric  Gait: normal gait and balance  R sided dix- hallpike positive. Epley performed.     Psychiatric:        Mood and Affect: Mood normal.        Behavior: Behavior normal.      No results found for any visits on 10/24/24.  Last CBC Lab Results  Component Value Date   WBC 4.7 07/23/2024   HGB 14.3 07/23/2024   HCT 42.6 07/23/2024   MCV 96 07/23/2024   MCH 32.1 07/23/2024   RDW 12.0 07/23/2024   PLT 251 07/23/2024   Last metabolic panel Lab Results  Component Value Date   GLUCOSE 93 08/01/2024   NA 141 08/01/2024   K 4.4 08/01/2024   CL 107 (H) 08/01/2024   CO2 21 08/01/2024   BUN 8 08/01/2024   CREATININE 0.76 08/01/2024   EGFR 114 08/01/2024   CALCIUM 8.7 08/01/2024   PROT 6.5 07/23/2024   ALBUMIN 4.3 07/23/2024   LABGLOB 2.2 07/23/2024   BILITOT 0.2 07/23/2024   ALKPHOS 70 07/23/2024   AST 15 07/23/2024   ALT 9 07/23/2024   Last lipids Lab Results  Component Value Date   CHOL 164 07/23/2024   HDL 71 07/23/2024   LDLCALC 77 07/23/2024   TRIG 87 07/23/2024   CHOLHDL 2.3 07/23/2024   Last hemoglobin A1c Lab Results  Component Value Date   HGBA1C 5.3 07/23/2024      The ASCVD Risk score (Arnett DK, et al., 2019) failed to calculate for the following reasons:   The 2019 ASCVD risk score is only valid for ages 11 to 83    Assessment & Plan:   Dizziness Assessment & Plan: Normal neuroexam with mildly positive Dix-Hallpike on the right side today suggesting possible canalith involvement, Epley performed and was provided with sheet on how to perform Epley on her own at home.  I suspect that this is likely multifactorial with underlying canalith as well as coexisting sickness with recent bouts of diarrhea and fever.  It has been going on for multiple days in a row and is  accompanied by headache without any alarm symptoms, I do not believe that this warrants any urgent workup.  I feel that this will likely be going away on its own. - Discussed increasing hydration - Rx for meclizine  3 times daily as needed  Orders: -     Meclizine  HCl; Take 1 tablet (25  mg total) by mouth 3 (three) times daily as needed for dizziness.  Dispense: 30 tablet; Refill: 0  Current severe episode of major depressive disorder without psychotic features without prior episode [facility]) Assessment & Plan: Chronic, not at goal.  Feels like overall mood has been better but could use some help with depression.  Will plan to add bupropion  extended release at 150 mg daily.  Orders: -     buPROPion  HCl ER (XL); Take 1 tablet (150 mg total) by mouth daily.  Dispense: 30 tablet; Refill: 2   Return if symptoms worsen or fail to improve.    Jamarco Zaldivar T Caryle Helgeson, PA-C

## 2024-10-26 DIAGNOSIS — R42 Dizziness and giddiness: Secondary | ICD-10-CM | POA: Insufficient documentation

## 2024-10-26 NOTE — Assessment & Plan Note (Addendum)
 Normal neuroexam with mildly positive Dix-Hallpike on the right side today suggesting possible canalith involvement, Epley performed and was provided with sheet on how to perform Epley on her own at home.  I suspect that this is likely multifactorial with underlying canalith as well as coexisting sickness with recent bouts of diarrhea and fever.  It has been going on for multiple days in a row and is accompanied by headache without any alarm symptoms, I do not believe that this warrants any urgent workup.  I feel that this will likely be going away on its own. - Discussed increasing hydration - Rx for meclizine  3 times daily as needed

## 2024-10-26 NOTE — Assessment & Plan Note (Signed)
 Chronic, not at goal.  Feels like overall mood has been better but could use some help with depression.  Will plan to add bupropion  extended release at 150 mg daily.

## 2024-12-03 ENCOUNTER — Ambulatory Visit (HOSPITAL_BASED_OUTPATIENT_CLINIC_OR_DEPARTMENT_OTHER): Admitting: Student
